# Patient Record
Sex: Female | Born: 1995 | Race: White | Hispanic: No | Smoking: Former smoker
Health system: Southern US, Community
[De-identification: ages and names within clinical notes are randomized; demographics above are authoritative.]

## PROBLEM LIST (undated history)

## (undated) DIAGNOSIS — F419 Anxiety disorder, unspecified: Secondary | ICD-10-CM

## (undated) DIAGNOSIS — F32A Depression, unspecified: Secondary | ICD-10-CM

## (undated) DIAGNOSIS — K219 Gastro-esophageal reflux disease without esophagitis: Secondary | ICD-10-CM

## (undated) DIAGNOSIS — F329 Major depressive disorder, single episode, unspecified: Secondary | ICD-10-CM

## (undated) HISTORY — PX: ORIF ULNAR / RADIAL SHAFT FRACTURE: SUR966

## (undated) HISTORY — PX: TONSILLECTOMY: SUR1361

## (undated) HISTORY — DX: Anxiety disorder, unspecified: F41.9

## (undated) HISTORY — PX: EYE SURGERY: SHX253

## (undated) HISTORY — DX: Major depressive disorder, single episode, unspecified: F32.9

## (undated) HISTORY — DX: Depression, unspecified: F32.A

## (undated) HISTORY — DX: Gastro-esophageal reflux disease without esophagitis: K21.9

---
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
## 2017-03-16 IMAGING — US US MFM OB COMP +14 WKS
1 series · 14 of 28 positions shown · non-contrast
Comparison: none

[Series 1: us mfm ob comp +14 wks · 14 of 74 slices shown]
[im 3/74]
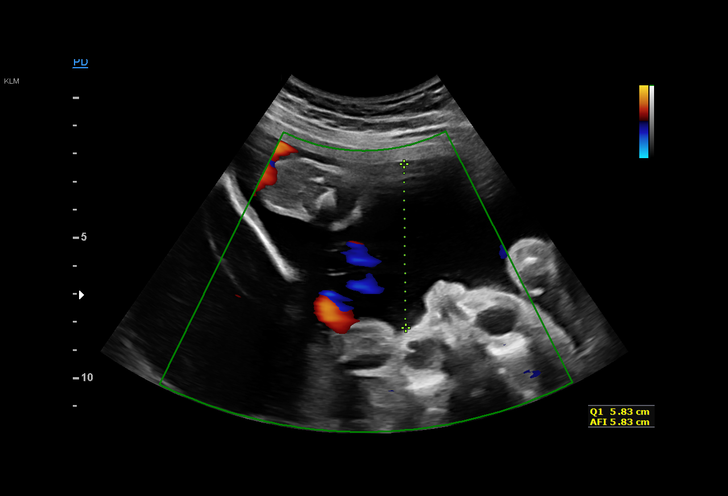
[im 9/74]
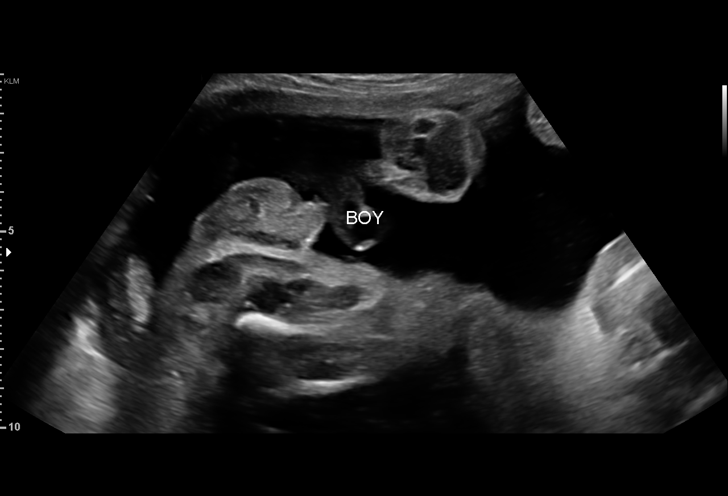
[im 14/74]
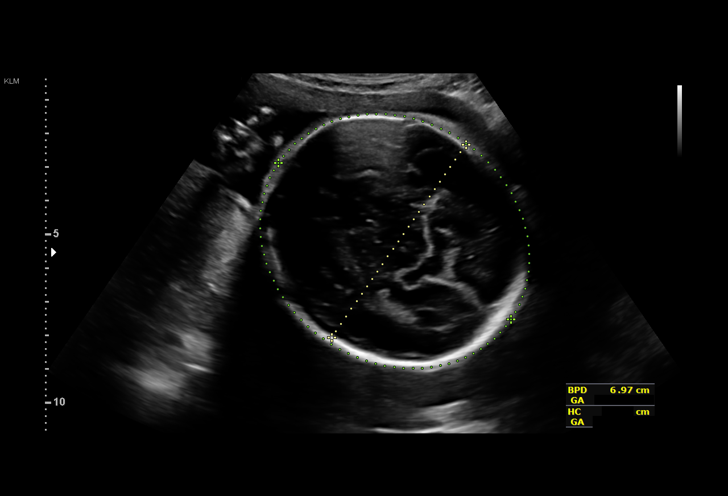
[im 19/74]
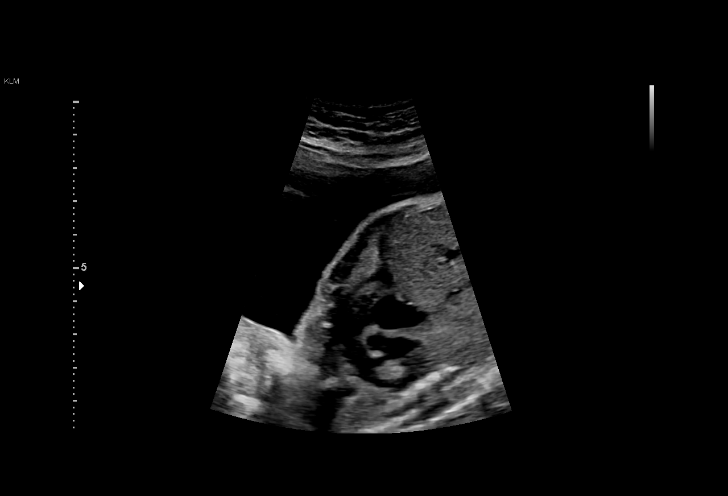
[im 25/74]
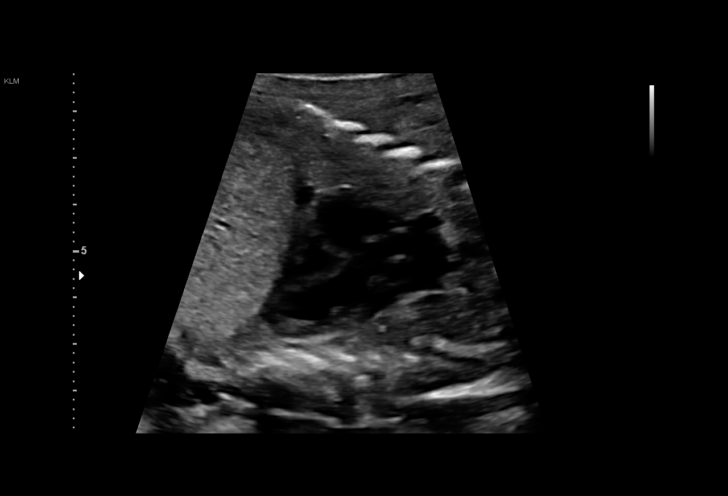
[im 30/74]
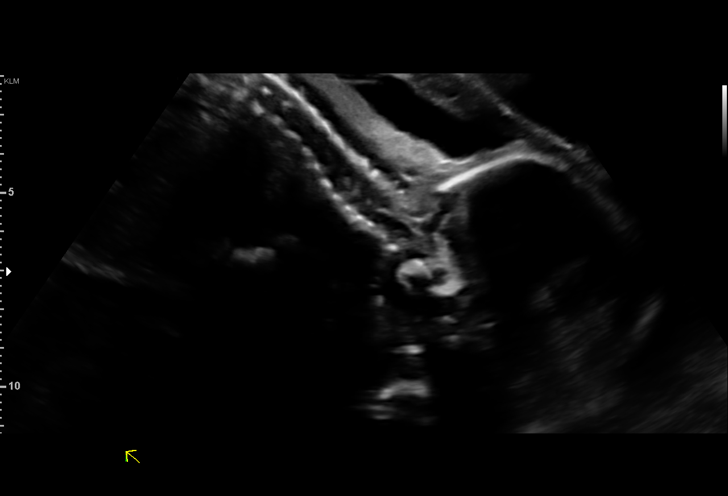
[im 36/74]
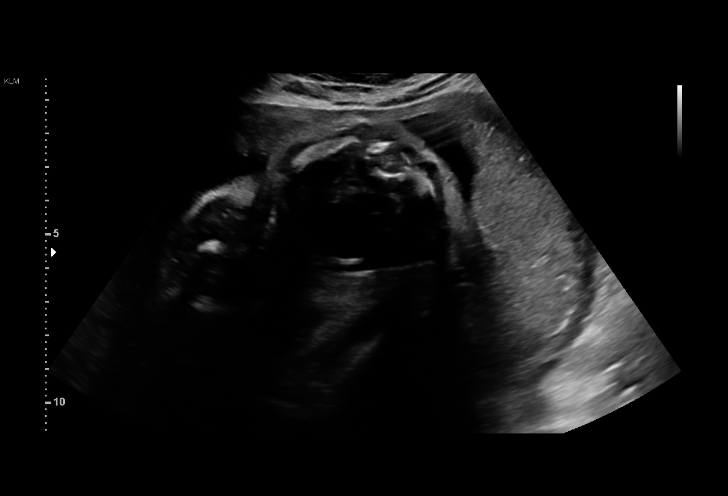
[im 41/74]
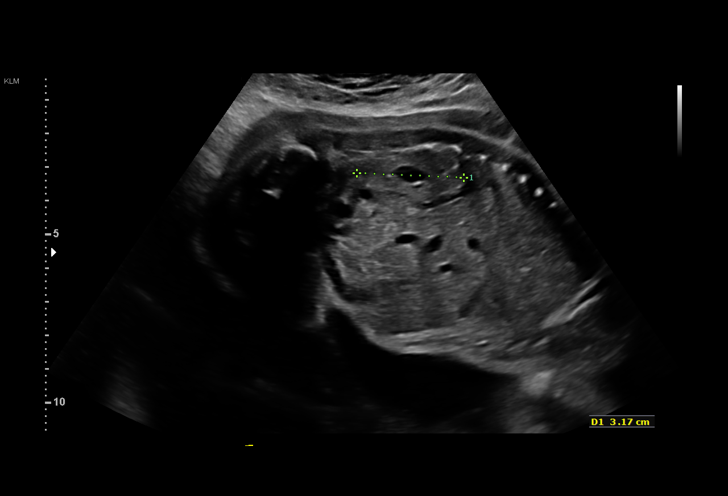
[im 46/74]
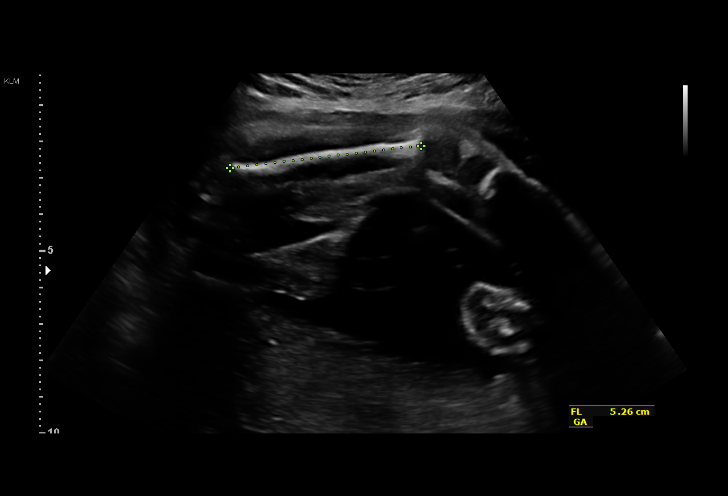
[im 52/74]
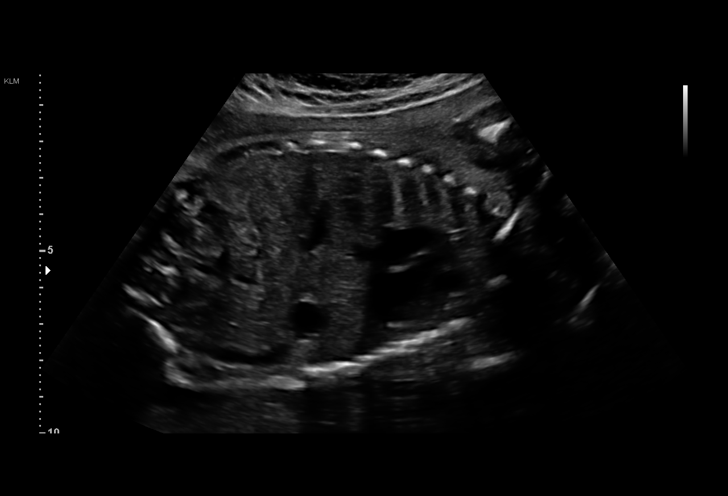
[im 57/74]
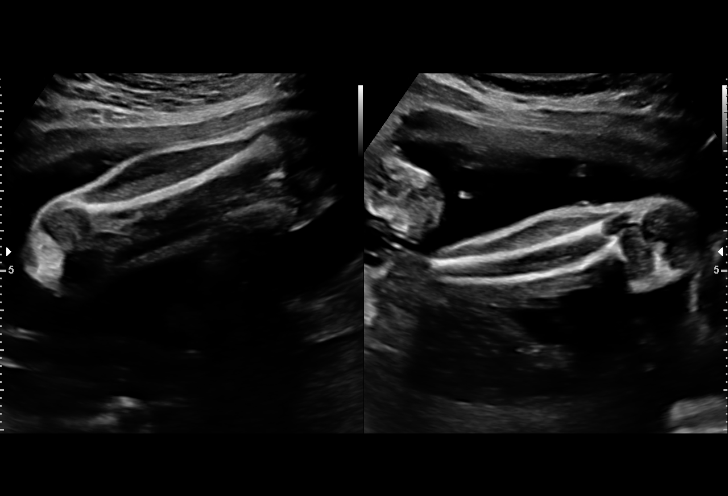
[im 63/74]
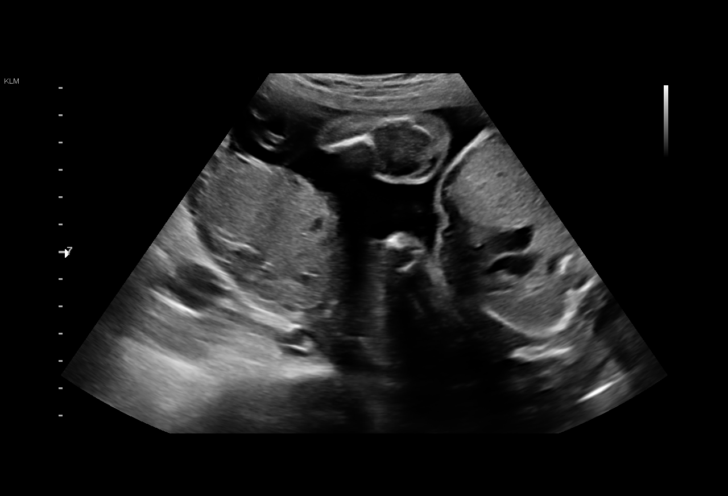
[im 68/74]
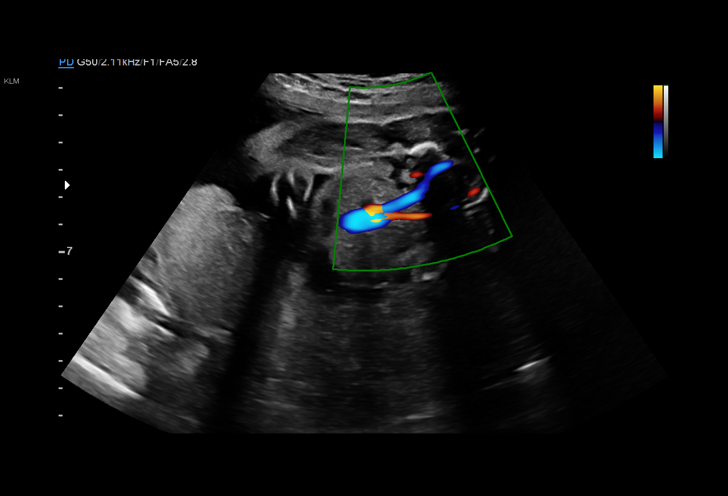
[im 74/74]
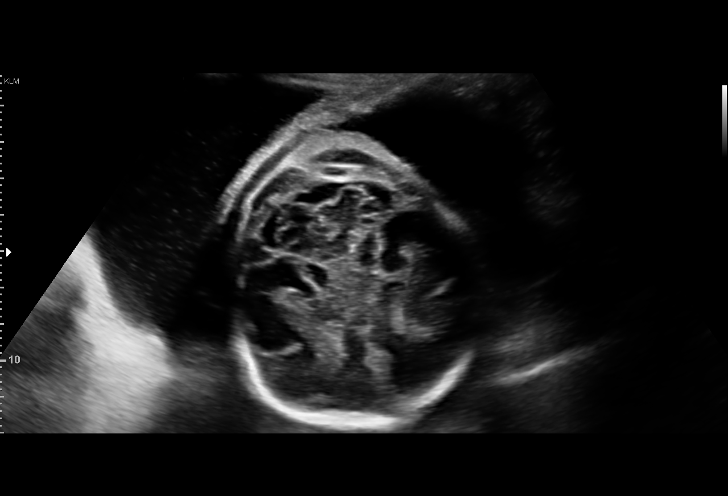

[14 of 28 positions shown; findings below may reference images not displayed]

1  JOINEL PAKKAM             [id number]      [id number]     [id number]
Indications

27 weeks gestation of pregnancy
Encounter for antenatal screening for
malformations
OB History

Gravidity:    1         Term:   0        Prem:   0         SAB:   0
TOP:          0       Ectopic:  0        Living: 0
Fetal Evaluation

Num Of Fetuses:     1
Fetal Heart         151
Rate(bpm):
Cardiac Activity:   Observed
Presentation:       Cephalic
Placenta:           Posterior, above cervical os
P. Cord Insertion:  Not well visualized

Amniotic Fluid
AFI FV:      Subjectively within normal limits

AFI Sum(cm)     %Tile       Largest Pocket(cm)
17.73           67

RUQ(cm)       RLQ(cm)       LUQ(cm)        LLQ(cm)
5.83
Biometry

BPD:      70.2  mm     G. Age:  28w 1d         65  %    CI:         81.71  %    70 - 86
FL/HC:       21.1  %    18.6 -
HC:      245.1  mm     G. Age:  26w 4d          7  %    HC/AC:       1.06       1.05 -
AC:      231.2  mm     G. Age:  27w 4d         44  %    FL/BPD:      73.8  %    71 - 87
FL:       51.8  mm     G. Age:  27w 4d         42  %    FL/AC:       22.4  %    20 - 24
HUM:      46.7  mm     G. Age:  27w 4d         48  %

Est. FW:    3048   gm     2 lb 6 oz     53  %
Gestational Age

LMP:           29w 0d        Date:  12/01/15                 EDD:    09/06/16
U/S Today:     27w 3d                                        EDD:    09/17/16
Best:          27w 3d     Det. By:  U/S (06/21/16)           EDD:    09/17/16
Anatomy

Cranium:               Appears normal         Aortic Arch:            Appears normal
Cavum:                 Appears normal         Ductal Arch:            Appears normal
Ventricles:            Appears normal         Diaphragm:              Appears normal
Choroid Plexus:        Appears normal         Stomach:                Appears normal, left
sided
Cerebellum:            Appears normal         Abdomen:                Appears normal
Posterior Fossa:       Appears normal         Abdominal Wall:         Appears nml (cord
insert, abd wall)
Nuchal Fold:           Not applicable (>20    Cord Vessels:           Appears normal (3
wks GA)                                        vessel cord)
Face:                  Appears normal         Kidneys:                Appear normal
(orbits and profile)
Lips:                  Appears normal         Bladder:                Appears normal
Thoracic:              Appears normal         Spine:                  Appears normal
Heart:                 Appears normal         Upper Extremities:      Appears normal
(4CH, axis, and situs
RVOT:                  Appears normal         Lower Extremities:      Appears normal
LVOT:                  Appears normal

Other:  Fetus appears to be a male. Heels visualized. Technically difficult due
to fetal position. Technically difficult due to advanced gestational age.
Cervix Uterus Adnexa

Cervix
Not visualized (advanced GA >99wks)
Impression

SIUP at 27+3 weeks
Normal detailed fetal anatomy
Normal amniotic fluid volume
EDC based on today's measurements: 09/17/16
Recommendations

Follow-up as clinically indicated
